# Patient Record
Sex: Male | Born: 1995 | Race: Black or African American | Hispanic: No | Marital: Single | Smoking: Never smoker
Health system: Southern US, Community
[De-identification: ages and names within clinical notes are randomized; demographics above are authoritative.]

## PROBLEM LIST (undated history)

## (undated) DIAGNOSIS — T7840XA Allergy, unspecified, initial encounter: Secondary | ICD-10-CM

## (undated) HISTORY — DX: Allergy, unspecified, initial encounter: T78.40XA

---
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
## 2013-09-11 ENCOUNTER — Encounter: Payer: Self-pay | Admitting: Family Medicine

## 2013-09-11 ENCOUNTER — Ambulatory Visit (INDEPENDENT_AMBULATORY_CARE_PROVIDER_SITE_OTHER): Payer: BC Managed Care – PPO | Admitting: Family Medicine

## 2013-09-11 VITALS — BP 140/80 | HR 83 | Temp 98.7°F | Resp 18 | Ht 71.0 in | Wt 198.0 lb

## 2013-09-11 DIAGNOSIS — R112 Nausea with vomiting, unspecified: Secondary | ICD-10-CM

## 2013-09-11 DIAGNOSIS — R413 Other amnesia: Secondary | ICD-10-CM

## 2013-09-11 LAB — TSH: TSH: 1.27 u[IU]/mL (ref 0.40–5.00)

## 2013-09-11 LAB — CBC WITH DIFFERENTIAL/PLATELET
Basophils Absolute: 0 10*3/uL (ref 0.0–0.1)
Basophils Relative: 0.4 % (ref 0.0–3.0)
EOS PCT: 2.4 % (ref 0.0–5.0)
Eosinophils Absolute: 0.1 10*3/uL (ref 0.0–0.7)
HEMATOCRIT: 42.2 % (ref 36.0–49.0)
HEMOGLOBIN: 14.2 g/dL (ref 12.0–16.0)
LYMPHS ABS: 1.5 10*3/uL (ref 0.7–4.0)
Lymphocytes Relative: 30.3 % (ref 24.0–48.0)
MCHC: 33.7 g/dL (ref 31.0–37.0)
MCV: 87.3 fl (ref 78.0–98.0)
MONO ABS: 0.4 10*3/uL (ref 0.1–1.0)
MONOS PCT: 7.7 % (ref 3.0–12.0)
NEUTROS ABS: 2.9 10*3/uL (ref 1.4–7.7)
Neutrophils Relative %: 59.2 % (ref 43.0–71.0)
PLATELETS: 178 10*3/uL (ref 150.0–575.0)
RBC: 4.84 Mil/uL (ref 3.80–5.70)
RDW: 13.1 % (ref 11.4–15.5)
WBC: 4.9 10*3/uL (ref 4.5–13.5)

## 2013-09-11 LAB — HEPATIC FUNCTION PANEL
ALBUMIN: 4.6 g/dL (ref 3.5–5.2)
ALT: 23 U/L (ref 0–53)
AST: 25 U/L (ref 0–37)
Alkaline Phosphatase: 57 U/L (ref 52–171)
BILIRUBIN TOTAL: 1.1 mg/dL (ref 0.2–1.2)
Bilirubin, Direct: 0.2 mg/dL (ref 0.0–0.3)
TOTAL PROTEIN: 7.6 g/dL (ref 6.0–8.3)

## 2013-09-11 LAB — VITAMIN B12: VITAMIN B 12: 340 pg/mL (ref 211–911)

## 2013-09-11 NOTE — Progress Notes (Signed)
Pre-visit discussion using our clinic review tool. No additional management support is needed unless otherwise documented below in the visit note.  

## 2013-09-11 NOTE — Patient Instructions (Signed)
We will call you with labs.   Will consider referral to psychologist for ADD testing if labs normal.

## 2013-09-11 NOTE — Progress Notes (Signed)
   Subjective:    Patient ID: Larry Carney, male    DOB: 01/27/1996, 18 y.o.   MRN: [id number]  Abdominal Pain Associated symptoms include vomiting. Pertinent negatives include no fever or headaches.  Emesis  Associated symptoms include abdominal pain. Pertinent negatives include no chest pain, chills, dizziness, fever or headaches.   Patient seen with concern for possible memory deficits. He had heavy course of this past semester in college and flunked couple of courses though they were very difficult science courses. He does have and more difficulty with retaining information he has studied. He does not have any chronic medical problems and takes no regular medications. No recent head injury. No history of concussion. No focal neurologic symptoms. No recent headaches.  He does complain of difficulty focusing which he thinks has gotten worse lately. No illicit drug use.  Patient had recent episode over the past weekend of vomiting one day duration. He has some nonspecific intermittent upper abdominal pains which are very inconsistent with no clear triggers. Appetite and weight are stable. Patient states he had very erratic eating of the past semester because of lack of meal plan and eating on the run.  had very little variety in his diet. Nonvegetarian.  Past Medical History  Diagnosis Date  . Allergy    No past surgical history on file.  reports that he has never smoked. He does not have any smokeless tobacco history on file. His alcohol and drug histories are not on file. family history includes Hypertension in his maternal grandfather, maternal grandmother, paternal grandfather, and paternal grandmother; Stroke in his maternal grandmother. No Known Allergies    Review of Systems  Constitutional: Negative for fever and chills.  Respiratory: Negative for shortness of breath.   Cardiovascular: Negative for chest pain.  Gastrointestinal: Positive for vomiting and abdominal pain.    Neurological: Negative for dizziness, tremors, seizures, syncope, weakness and headaches.  Hematological: Negative for adenopathy.       Objective:   Physical Exam  Constitutional: He is oriented to person, place, and time. He appears well-developed and well-nourished.  Cardiovascular: Normal rate and regular rhythm.  Exam reveals no gallop.   No murmur heard. Pulmonary/Chest: Effort normal and breath sounds normal. No respiratory distress. He has no wheezes. He has no rales.  Abdominal: Soft. There is no tenderness.  Musculoskeletal: He exhibits no edema.  Neurological: He is alert and oriented to person, place, and time. No cranial nerve deficit.  Psychiatric: He has a normal mood and affect. His behavior is normal. Judgment and thought content normal.          Assessment & Plan:  #1 concern for subjective memory changes. He is oriented to day of week, date, year. Normal serial 7 subtracton.  Check labs with B12, TSH, hepatic, CBC. Consider referral to psychology for ADD testing #2 recent episode of vomiting over the weekend and question enteritis. Those symptoms are fully resolved.

## 2013-09-13 NOTE — Addendum Note (Signed)
Addended by: Kristian CoveyBURCHETTE, Bob Daversa W on: 09/13/2013 12:51 PM   Modules accepted: Orders

## 2013-09-14 ENCOUNTER — Other Ambulatory Visit: Payer: Self-pay

## 2013-09-14 DIAGNOSIS — F988 Other specified behavioral and emotional disorders with onset usually occurring in childhood and adolescence: Secondary | ICD-10-CM

## 2013-09-30 ENCOUNTER — Telehealth: Payer: Self-pay | Admitting: Family Medicine

## 2013-09-30 DIAGNOSIS — F988 Other specified behavioral and emotional disorders with onset usually occurring in childhood and adolescence: Secondary | ICD-10-CM

## 2013-09-30 DIAGNOSIS — R413 Other amnesia: Secondary | ICD-10-CM

## 2013-09-30 NOTE — Telephone Encounter (Signed)
Pt called to say that Dr Caryl Never had req that her son see a psy and would like to know if this has been set up mon

## 2013-10-01 ENCOUNTER — Other Ambulatory Visit: Payer: Self-pay | Admitting: Family Medicine

## 2013-10-01 DIAGNOSIS — R413 Other amnesia: Secondary | ICD-10-CM

## 2013-10-01 DIAGNOSIS — F988 Other specified behavioral and emotional disorders with onset usually occurring in childhood and adolescence: Secondary | ICD-10-CM

## 2013-10-01 NOTE — Telephone Encounter (Signed)
We put in referral to Dr Reggy Eye.  Can we see where that stands?

## 2013-10-01 NOTE — Telephone Encounter (Signed)
Ordered the referral for the patient.

## 2013-10-08 ENCOUNTER — Telehealth: Payer: Self-pay | Admitting: Family Medicine

## 2013-10-08 NOTE — Telephone Encounter (Signed)
Per dr. Reggy Eye the pt can not be seen in developmental/phys center because of his insurance, however he can be seen at [facility] and dr. Reggy Eye is there on Wednesday 9-5 and Friday from 9-12. Requesting referral to be sent there

## 2015-09-30 ENCOUNTER — Ambulatory Visit (INDEPENDENT_AMBULATORY_CARE_PROVIDER_SITE_OTHER): Payer: BC Managed Care – PPO | Admitting: Family Medicine

## 2015-09-30 ENCOUNTER — Encounter: Payer: Self-pay | Admitting: Family Medicine

## 2015-09-30 VITALS — BP 100/64 | HR 66 | Temp 98.2°F | Ht 71.0 in | Wt 201.9 lb

## 2015-09-30 DIAGNOSIS — Z0000 Encounter for general adult medical examination without abnormal findings: Secondary | ICD-10-CM

## 2015-09-30 LAB — LIPID PANEL
CHOLESTEROL: 116 mg/dL (ref 0–200)
HDL: 35.8 mg/dL — ABNORMAL LOW (ref >39.00)
LDL CALC: 69 mg/dL (ref 0–99)
NonHDL: 79.79
Total CHOL/HDL Ratio: 3
Triglycerides: 52 mg/dL (ref 0.0–149.0)
VLDL: 10.4 mg/dL (ref 0.0–40.0)

## 2015-09-30 LAB — CBC WITH DIFFERENTIAL/PLATELET
BASOS PCT: 1 % (ref 0.0–3.0)
Basophils Absolute: 0 10*3/uL (ref 0.0–0.1)
EOS ABS: 0.1 10*3/uL (ref 0.0–0.7)
Eosinophils Relative: 2.8 % (ref 0.0–5.0)
HEMATOCRIT: 42.1 % (ref 39.0–52.0)
HEMOGLOBIN: 14 g/dL (ref 13.0–17.0)
LYMPHS PCT: 34.5 % (ref 12.0–46.0)
Lymphs Abs: 1.5 10*3/uL (ref 0.7–4.0)
MCHC: 33.3 g/dL (ref 30.0–36.0)
MCV: 87.4 fl (ref 78.0–100.0)
MONOS PCT: 6.8 % (ref 3.0–12.0)
Monocytes Absolute: 0.3 10*3/uL (ref 0.1–1.0)
Neutro Abs: 2.5 10*3/uL (ref 1.4–7.7)
Neutrophils Relative %: 54.9 % (ref 43.0–77.0)
Platelets: 172 10*3/uL (ref 150.0–400.0)
RBC: 4.82 Mil/uL (ref 4.22–5.81)
RDW: 12.8 % (ref 11.5–15.5)
WBC: 4.5 10*3/uL (ref 4.0–10.5)

## 2015-09-30 LAB — BASIC METABOLIC PANEL
BUN: 12 mg/dL (ref 6–23)
CALCIUM: 9.9 mg/dL (ref 8.4–10.5)
CHLORIDE: 105 meq/L (ref 96–112)
CO2: 29 mEq/L (ref 19–32)
CREATININE: 1.06 mg/dL (ref 0.40–1.50)
GFR: 112.63 mL/min (ref >60.00)
Glucose, Bld: 83 mg/dL (ref 70–99)
Potassium: 4.2 mEq/L (ref 3.5–5.1)
Sodium: 141 mEq/L (ref 135–145)

## 2015-09-30 LAB — HEPATIC FUNCTION PANEL
ALT: 19 U/L (ref 0–53)
AST: 40 U/L — AB (ref 0–37)
Albumin: 4.8 g/dL (ref 3.5–5.2)
Alkaline Phosphatase: 70 U/L (ref 39–117)
BILIRUBIN DIRECT: 0.1 mg/dL (ref 0.0–0.3)
BILIRUBIN TOTAL: 0.5 mg/dL (ref 0.2–1.2)
Total Protein: 7.4 g/dL (ref 6.0–8.3)

## 2015-09-30 LAB — TSH: TSH: 1.26 u[IU]/mL (ref 0.35–4.50)

## 2015-09-30 MED ORDER — ALUMINUM CHLORIDE 20 % EX SOLN: Freq: Every day | CUTANEOUS | Status: DC

## 2015-09-30 NOTE — Progress Notes (Signed)
Pre visit review using our clinic review tool, if applicable. No additional management support is needed unless otherwise documented below in the visit note. 

## 2015-09-30 NOTE — Progress Notes (Signed)
   Subjective:    Patient ID: Larry Carney, male    DOB: 09/05/1995, 20 y.o.   MRN: [id number]  HPI Patient is here requesting well visit. He is in college and plans to take summer classes. He is still undecided regarding major. Has generally been very healthy. Takes no regular medications. No cigarette use. Has smoked marijuana intermittently past couple of years. Denies any other illicit drug use. He exercises fairly regularly. He states he is not eating well during the past year with eating a lot of "junk food"  Past Medical History  Diagnosis Date  . Allergy    No past surgical history on file.  reports that he has never smoked. He does not have any smokeless tobacco history on file. His alcohol and drug histories are not on file. family history includes Hypertension in his maternal grandfather, maternal grandmother, paternal grandfather, and paternal grandmother; Stroke in his maternal grandmother. No Known Allergies     Review of Systems  Constitutional: Negative for fever, activity change, appetite change and fatigue.  HENT: Negative for congestion, ear pain and trouble swallowing.   Eyes: Negative for pain and visual disturbance.  Respiratory: Negative for cough, shortness of breath and wheezing.   Cardiovascular: Negative for chest pain and palpitations.  Gastrointestinal: Negative for nausea, vomiting, abdominal pain, diarrhea, constipation, blood in stool, abdominal distention and rectal pain.  Genitourinary: Negative for dysuria, hematuria and testicular pain.  Musculoskeletal: Negative for joint swelling and arthralgias.  Skin: Negative for rash.  Neurological: Negative for dizziness, syncope and headaches.  Hematological: Negative for adenopathy.  Psychiatric/Behavioral: Negative for confusion and dysphoric mood.       Objective:   Physical Exam  Constitutional: He is oriented to person, place, and time. He appears well-developed and well-nourished. No  distress.  HENT:  Head: Normocephalic and atraumatic.  Right Ear: External ear normal.  Left Ear: External ear normal.  Mouth/Throat: Oropharynx is clear and moist.  Eyes: Conjunctivae and EOM are normal. Pupils are equal, round, and reactive to light.  Neck: Normal range of motion. Neck supple. No thyromegaly present.  Cardiovascular: Normal rate, regular rhythm and normal heart sounds.   No murmur heard. Pulmonary/Chest: No respiratory distress. He has no wheezes. He has no rales.  Abdominal: Soft. Bowel sounds are normal. He exhibits no distension and no mass. There is no tenderness. There is no rebound and no guarding.  Musculoskeletal: He exhibits no edema.  Lymphadenopathy:    He has no cervical adenopathy.  Neurological: He is alert and oriented to person, place, and time. He displays normal reflexes. No cranial nerve deficit.  Skin: No rash noted.  Psychiatric: He has a normal mood and affect.          Assessment & Plan:  Physical exam. Obtain screening lab work. We discussed healthy eating habits. Long discussion regarding concerns regarding marijuana use. He is currently not using that or any other illicit drugs.   Kristian CoveyBruce W Kassandra Meriweather MD [facility]

## 2015-10-06 ENCOUNTER — Other Ambulatory Visit: Payer: Self-pay | Admitting: Family Medicine

## 2015-10-06 ENCOUNTER — Telehealth: Payer: Self-pay | Admitting: Family Medicine

## 2015-10-06 DIAGNOSIS — R7401 Elevation of levels of liver transaminase levels: Secondary | ICD-10-CM

## 2015-10-06 DIAGNOSIS — R740 Nonspecific elevation of levels of transaminase and lactic acid dehydrogenase [LDH]: Principal | ICD-10-CM

## 2015-10-06 NOTE — Telephone Encounter (Signed)
Pt mom would like her son blood work results. Mom is on DPR

## 2015-10-07 NOTE — Telephone Encounter (Signed)
Left message with mom to call back---but then saw that pt was already aware.

## 2015-12-06 ENCOUNTER — Other Ambulatory Visit: Payer: BC Managed Care – PPO

## 2015-12-06 ENCOUNTER — Other Ambulatory Visit (INDEPENDENT_AMBULATORY_CARE_PROVIDER_SITE_OTHER): Payer: BC Managed Care – PPO

## 2015-12-06 DIAGNOSIS — R7401 Elevation of levels of liver transaminase levels: Secondary | ICD-10-CM

## 2015-12-06 DIAGNOSIS — R740 Nonspecific elevation of levels of transaminase and lactic acid dehydrogenase [LDH]: Secondary | ICD-10-CM | POA: Diagnosis not present

## 2015-12-06 LAB — HEPATIC FUNCTION PANEL
ALK PHOS: 58 U/L (ref 39–117)
ALT: 14 U/L (ref 0–53)
AST: 24 U/L (ref 0–37)
Albumin: 4.6 g/dL (ref 3.5–5.2)
BILIRUBIN DIRECT: 0.1 mg/dL (ref 0.0–0.3)
BILIRUBIN TOTAL: 0.5 mg/dL (ref 0.2–1.2)
TOTAL PROTEIN: 7.2 g/dL (ref 6.0–8.3)

## 2016-05-08 ENCOUNTER — Telehealth: Payer: Self-pay | Admitting: Family Medicine

## 2016-05-08 NOTE — Telephone Encounter (Signed)
Pt called back and do not need the form filled out please disregard.

## 2016-05-08 NOTE — Telephone Encounter (Signed)
Pt would like to have a university form filled out so he is able to attend school it is need tomorrow they would like to drop form off today to be completed is it possible that this could be done today for the pt?

## 2016-05-09 ENCOUNTER — Encounter: Payer: BC Managed Care – PPO | Admitting: Family Medicine

## 2016-06-26 ENCOUNTER — Ambulatory Visit (INDEPENDENT_AMBULATORY_CARE_PROVIDER_SITE_OTHER): Payer: BC Managed Care – PPO | Admitting: Family Medicine

## 2016-06-26 ENCOUNTER — Encounter: Payer: Self-pay | Admitting: Family Medicine

## 2016-06-26 VITALS — BP 104/80 | HR 122 | Ht 71.0 in | Wt 213.0 lb

## 2016-06-26 DIAGNOSIS — R51 Headache: Secondary | ICD-10-CM

## 2016-06-26 DIAGNOSIS — R519 Headache, unspecified: Secondary | ICD-10-CM

## 2016-06-26 NOTE — Progress Notes (Signed)
Subjective:     Patient ID: Larry Carney, male   DOB: 11/13/1995, 21 y.o.   MRN: [id number]  HPI  Patient seen with chief complaint of headache.  Generally does not experience headaches. He's had three-week history of almost daily headaches. His headaches are somewhat intermittent. 6 out of 10 intensity. Achy quality. Location is right temporal region without radiation. He denies any sinusitis symptoms.  He has noted that headaches seem to be worse occasionally when bending over and seemed to be worse somewhat at night. No recent injury. He's taken some Tylenol without relief. Denies any associated nausea or vomiting. No appetite or weight changes. No fevers or chills. No dizziness. No syncope. No cognitive changes. He's not had any focal neurologic symptoms. No visual changes. No hearing loss. No history of tension or migraine headaches.  Generally healthy.Takes no regular medications.  Past Medical History:  Diagnosis Date  . Allergy    No past surgical history on file.  reports that he has never smoked. He has never used smokeless tobacco. His alcohol and drug histories are not on file. family history includes Hypertension in his maternal grandfather, maternal grandmother, paternal grandfather, and paternal grandmother; Stroke in his maternal grandmother. No Known Allergies  Review of Systems  Constitutional: Negative for appetite change, chills, fever and unexpected weight change.  Eyes: Negative for visual disturbance.  Respiratory: Negative for cough and shortness of breath.   Cardiovascular: Negative for chest pain.  Neurological: Positive for headaches. Negative for dizziness, seizures, syncope, facial asymmetry, speech difficulty and weakness.       Objective:   Physical Exam  Constitutional: He is oriented to person, place, and time. He appears well-developed and well-nourished. No distress.  Eyes: Pupils are equal, round, and reactive to light.  Neck: Neck supple.  No thyromegaly present.  Cardiovascular: Normal rate and regular rhythm.   Pulmonary/Chest: Effort normal and breath sounds normal. No respiratory distress. He has no wheezes. He has no rales.  Lymphadenopathy:    He has no cervical adenopathy.  Neurological: He is alert and oriented to person, place, and time. No cranial nerve deficit. Coordination normal.  Skin: No rash noted.       Assessment:     Atypical unilateral headache. Atypical features include the fact this is unilateral and does not fit criteria for migraine or tension-type headache. He does have some exacerbation with movement such as bending. Fortunately, nonfocal neuro exam    Plan:     -Recommend further evaluation with neuro imaging with MRI and MR angiogram. -Handout given on headache -Follow-up immediately for any recurrent vomiting, focal neurologic symptoms, confusion, seizure  Kristian CoveyBruce W Merlon Alcorta MD [facility]

## 2016-06-26 NOTE — Progress Notes (Signed)
Pre visit review using our clinic review tool, if applicable. No additional management support is needed unless otherwise documented below in the visit note. 

## 2016-06-26 NOTE — Patient Instructions (Signed)
We will set up MRI to further assess your headaches.     General Headache Without Cause A headache is pain or discomfort felt around the head or neck area. The specific cause of a headache may not be found. There are many causes and types of headaches. A few common ones are:  Tension headaches.  Migraine headaches.  Cluster headaches.  Chronic daily headaches. Follow these instructions at home: Watch your condition for any changes. Take these steps to help with your condition: Managing pain   Take over-the-counter and prescription medicines only as told by your health care provider.  Lie down in a dark, quiet room when you have a headache.  If directed, apply ice to the head and neck area:  Put ice in a plastic bag.  Place a towel between your skin and the bag.  Leave the ice on for 20 minutes, 2-3 times per day.  Use a heating pad or hot shower to apply heat to the head and neck area as told by your health care provider.  Keep lights dim if bright lights bother you or make your headaches worse. Eating and drinking   Eat meals on a regular schedule.  Limit alcohol use.  Decrease the amount of caffeine you drink, or stop drinking caffeine. General instructions   Keep all follow-up visits as told by your health care provider. This is important.  Keep a headache journal to help find out what may trigger your headaches. For example, write down:  What you eat and drink.  How much sleep you get.  Any change to your diet or medicines.  Try massage or other relaxation techniques.  Limit stress.  Sit up straight, and do not tense your muscles.  Do not use tobacco products, including cigarettes, chewing tobacco, or e-cigarettes. If you need help quitting, ask your health care provider.  Exercise regularly as told by your health care provider.  Sleep on a regular schedule. Get 7-9 hours of sleep, or the amount recommended by your health care provider. Contact a  health care provider if:  Your symptoms are not helped by medicine.  You have a headache that is different from the usual headache.  You have nausea or you vomit.  You have a fever. Get help right away if:  Your headache becomes severe.  You have repeated vomiting.  You have a stiff neck.  You have a loss of vision.  You have problems with speech.  You have pain in the eye or ear.  You have muscular weakness or loss of muscle control.  You lose your balance or have trouble walking.  You feel faint or pass out.  You have confusion. This information is not intended to replace advice given to you by your health care provider. Make sure you discuss any questions you have with your health care provider. Document Released: 04/16/2005 Document Revised: 09/22/2015 Document Reviewed: 08/09/2014 Elsevier Interactive Patient Education  2017 [employer]

## 2016-07-05 ENCOUNTER — Telehealth: Payer: Self-pay | Admitting: Family Medicine

## 2016-07-05 NOTE — Telephone Encounter (Signed)
The insurance company didn't approve the patients MR MRA HEAD WO CONTRAST only the MRI.  [facility], please contact Danelle Earthlyoel @[phone]

## 2016-07-05 NOTE — Telephone Encounter (Signed)
I guess we will have to go along with just the MRI-  Let pt know his insurance is refusing to cover the MRA.

## 2016-07-07 ENCOUNTER — Ambulatory Visit
Admission: RE | Admit: 2016-07-07 | Discharge: 2016-07-07 | Disposition: A | Discharge status: Home / Self Care | Payer: BC Managed Care – PPO | Source: Ambulatory Visit | Attending: Family Medicine | Admitting: Family Medicine

## 2016-07-07 ENCOUNTER — Other Ambulatory Visit: Payer: BC Managed Care – PPO

## 2016-07-07 DIAGNOSIS — R519 Headache, unspecified: Secondary | ICD-10-CM

## 2016-07-07 DIAGNOSIS — R51 Headache: Principal | ICD-10-CM

## 2016-09-05 ENCOUNTER — Ambulatory Visit (INDEPENDENT_AMBULATORY_CARE_PROVIDER_SITE_OTHER): Payer: BC Managed Care – PPO | Admitting: Family Medicine

## 2016-09-05 ENCOUNTER — Encounter: Payer: Self-pay | Admitting: Family Medicine

## 2016-09-05 VITALS — BP 120/80 | HR 64 | Temp 98.3°F | Wt 213.5 lb

## 2016-09-05 DIAGNOSIS — H109 Unspecified conjunctivitis: Secondary | ICD-10-CM | POA: Diagnosis not present

## 2016-09-05 DIAGNOSIS — J069 Acute upper respiratory infection, unspecified: Secondary | ICD-10-CM

## 2016-09-05 DIAGNOSIS — B9789 Other viral agents as the cause of diseases classified elsewhere: Secondary | ICD-10-CM | POA: Diagnosis not present

## 2016-09-05 MED ORDER — POLYMYXIN B-TRIMETHOPRIM 10000-0.1 UNIT/ML-% OP SOLN: 2.0000 [drp] | OPHTHALMIC | 0 refills | Status: DC

## 2016-09-05 NOTE — Patient Instructions (Signed)

## 2016-09-05 NOTE — Progress Notes (Signed)
Subjective:     Patient ID: Larry Carney, male   DOB: 10/28/1995, 21 y.o.   MRN: [id number]  HPI Patient seen with one-week history of sore throat, nasal congestion, cough.  also a couple days of some right eye redness. He's had some matted thick drainage early in the mornings. Improves with warm compresses. No blurred vision or loss of vision. No contact use. No eye injury. Sore throat symptoms of been worse at night. May of had some low-grade fever initially but none now. Cough mostly dry  Past Medical History:  Diagnosis Date  . Allergy    No past surgical history on file.  reports that he has never smoked. He has never used smokeless tobacco. His alcohol and drug histories are not on file. family history includes Hypertension in his maternal grandfather, maternal grandmother, paternal grandfather, and paternal grandmother; Stroke in his maternal grandmother. No Known Allergies   Review of Systems  Constitutional: Positive for fatigue. Negative for chills and fever.  HENT: Positive for congestion and sore throat.   Eyes: Positive for discharge, redness and itching. Negative for visual disturbance.  Respiratory: Positive for cough.        Objective:   Physical Exam  Constitutional: He appears well-developed and well-nourished.  HENT:  Right Ear: External ear normal.  Left Ear: External ear normal.  Minimal erythema posterior pharynx. No exudate  Eyes: EOM are normal.  Right conjunctivae erythematous. Left is normal  Neck: Neck supple.  Cardiovascular: Normal rate and regular rhythm.   Pulmonary/Chest: Effort normal and breath sounds normal. No respiratory distress. He has no wheezes. He has no rales.  Lymphadenopathy:    He has no cervical adenopathy.       Assessment:     #1 probable viral URI with cough  #2 bacterial conjunctivitis right eye    Plan:     -Continue warm compresses to right eye several times daily -Polytrim ophthalmic drops to right eye every 4  hours while awake -Treat URI symptoms symptomatically. Try Advil or Aleve for sore throat symptoms  Kristian CoveyBruce W Burchette MD [facility]

## 2017-07-15 ENCOUNTER — Ambulatory Visit (INDEPENDENT_AMBULATORY_CARE_PROVIDER_SITE_OTHER): Payer: BC Managed Care – PPO | Admitting: Family Medicine

## 2017-07-15 ENCOUNTER — Encounter: Payer: Self-pay | Admitting: Family Medicine

## 2017-07-15 VITALS — BP 110/70 | HR 65 | Temp 98.4°F | Wt 208.6 lb

## 2017-07-15 DIAGNOSIS — M76899 Other specified enthesopathies of unspecified lower limb, excluding foot: Secondary | ICD-10-CM

## 2017-07-15 MED ORDER — DICLOFENAC SODIUM 1 % TD GEL: 2.0000 g | Freq: Four times a day (QID) | TRANSDERMAL | 1 refills | Status: DC

## 2017-07-15 MED ORDER — ALUMINUM CHLORIDE 20 % EX SOLN: Freq: Every day | CUTANEOUS | 1 refills | Status: DC

## 2017-07-15 NOTE — Patient Instructions (Signed)
Use the Voltaren gel three to four times daily Recommend ice 2-3 times daily.   Let me know in 3-4 weeks if not better Avoid squats.

## 2017-07-15 NOTE — Progress Notes (Signed)
Subjective:     Patient ID: Larry Carney, male   DOB: 03/22/1996, 22 y.o.   MRN: [id number]  HPI Patient seen with right knee pain. Duration about 4-5 month. Denies specific injury. 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 That seems be aggravating. Pain is just above the patella region (superior to patella). He's not seen any weakness. No visible swelling. No ecchymosis. No warmth or erythema. Has not tried any icing. No anti-inflammatory medications.  Past Medical History:  Diagnosis Date  . Allergy    No past surgical history on file.  reports that  has never smoked. he has never used smokeless tobacco. His alcohol and drug histories are not on file. family history includes Hypertension in his maternal grandfather, maternal grandmother, paternal grandfather, and paternal grandmother; Stroke in his maternal grandmother. No Known Allergies   Review of Systems  Neurological: Negative for weakness and numbness.       Objective:   Physical Exam  Constitutional: He appears well-developed and well-nourished.  Cardiovascular: Normal rate and regular rhythm.  Musculoskeletal:  Right knee full range of motion. No effusion. No prepatellar tenderness. Minimal tenderness just superior to patellar region. Mild pain with leg extension against resistance. No medial or lateral jointline tenderness       Assessment:     Question quadriceps tendonitis.      Plan:     -Recommended icing 2-3 times daily 15-20 minutes of time -Avoid aggravating activities such as squatting -Voltaren gel applied 3-4 times daily -Touch base in 3-4 weeks if not improving  -consider sports medicine referral if no better in 3 weeks.  Kristian CoveyBruce W Burchette MD [facility]

## 2017-07-16 ENCOUNTER — Telehealth: Payer: Self-pay | Admitting: Family Medicine

## 2017-07-16 NOTE — Telephone Encounter (Signed)
Copied from CRM [id number]. Topic: Quick Communication - See Telephone Encounter >> Jul 16, 2017  9:16 AM Clack, Princella PellegriniJessica D wrote: CRM for notification. See Telephone encounter for:  Pt states his insurance needs a PA on his diclofenac sodium (VOLTAREN) 1 % GEL [[id number]][[id number]].  07/16/17.

## 2017-07-16 NOTE — Telephone Encounter (Signed)
Prior auth sent to [id number].

## 2017-07-18 NOTE — Telephone Encounter (Signed)
Note in [email or web address] stated the request was denied.  I called the pt and informed him of this and he is aware a message will be sent to Dr Caryl NeverBurchette for further recommendations.

## 2017-07-18 NOTE — Telephone Encounter (Signed)
Patient checking status, call back [phone]

## 2017-07-19 NOTE — Telephone Encounter (Signed)
Informed pt .

## 2017-07-19 NOTE — Telephone Encounter (Signed)
I left a detailed message with the information below at the pts cell number. 

## 2017-07-19 NOTE — Telephone Encounter (Signed)
Doubt his insurance will cover any topicals.  Would focus on icing 20 minutes two to three times daily.

## 2017-09-13 IMAGING — MR MR HEAD W/O CM
10 of 11 series · 38 of 48 positions shown · non-contrast
Comparison: None.

CLINICAL DATA: Progressive right-sided headache over the last
weeks.

EXAM:
MRI HEAD WITHOUT CONTRAST
TECHNIQUE: Multiplanar, multiecho pulse sequences of the brain and surrounding
structures were obtained without intravenous contrast.

[Series 2: T1 · sagittal · 5.0mm · 0.49mm/px · 2 of 23 slices shown]
[im 1/23]
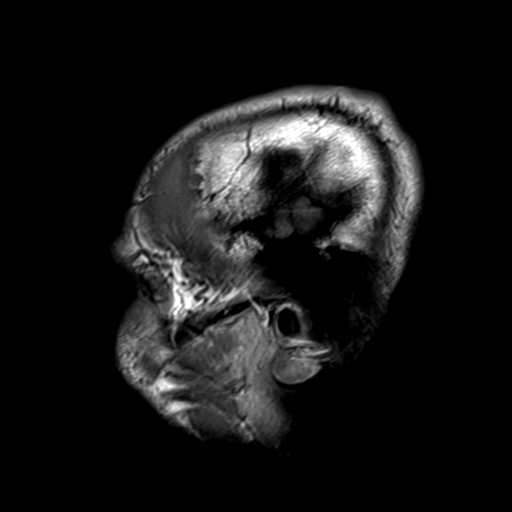
[im 23/23]
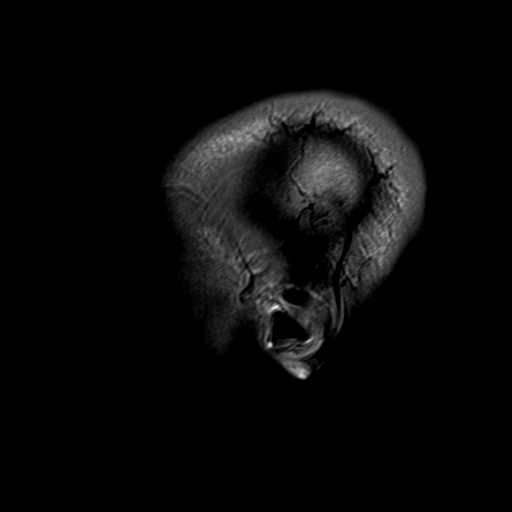

[Series 3: ax ep2d_diff_(id)_trace · axial · 3.0mm · 1.88mm/px · z∈[-60,+93]mm · 7 of 102 slices shown]
[im 1/102]
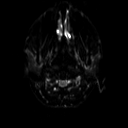
[im 17/102]
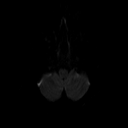
[im 34/102]
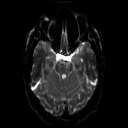
[im 51/102]
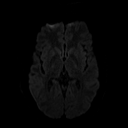
[im 68/102]
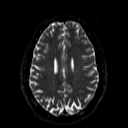
[im 85/102]
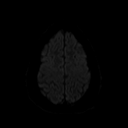
[im 102/102]
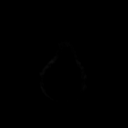

[Series 4: ax ep2d_diff_(id)_trace_adc · axial · 3.0mm · 1.88mm/px · z∈[-60,+93]mm · 4 of 51 slices shown]
[im 1/51]
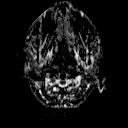
[im 17/51]
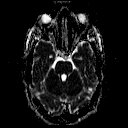
[im 34/51]
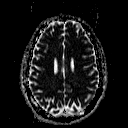
[im 51/51]
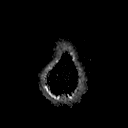

[Series 5: mip_images(sw) · axial · 16.0mm · 0.94mm/px · z∈[-55,+88]mm · 5 of 73 slices shown]
[im 1/73]
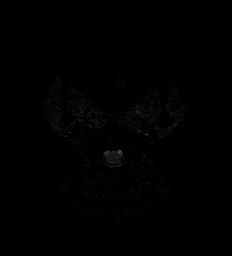
[im 19/73]
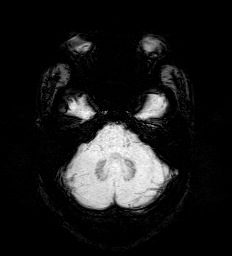
[im 37/73]
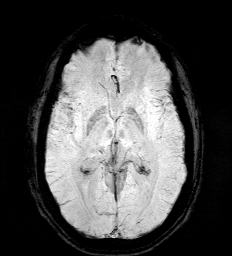
[im 55/73]
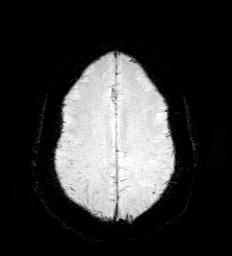
[im 73/73]
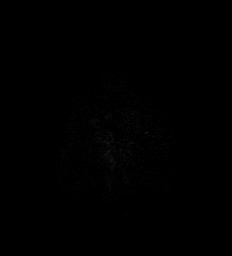

[Series 6: swi_images · axial · 2.0mm · 0.94mm/px · z∈[-62,+95]mm · 6 of 80 slices shown]
[im 1/80]
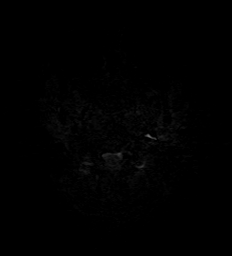
[im 16/80]
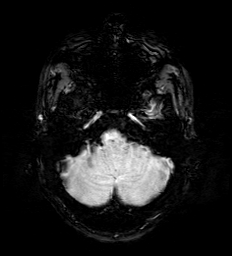
[im 32/80]
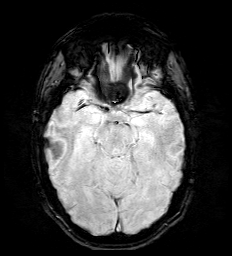
[im 48/80]
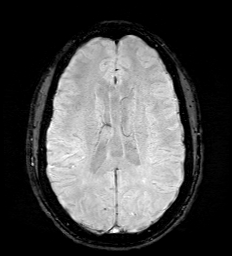
[im 64/80]
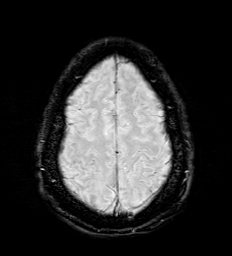
[im 80/80]
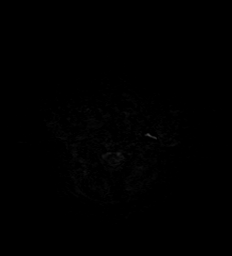

[Series 7: cor ep2d_diff (if · coronal · 5.0mm · 1.77mm/px · 4 of 60 slices shown (1 of 2)]
[im 1/60]
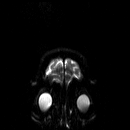
[im 20/60]
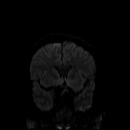
[im 40/60]
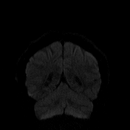
[im 60/60]
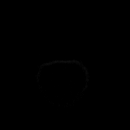

[Series 8: cor ep2d_diff (if · coronal · 5.0mm · 1.77mm/px · 2 of 30 slices shown (2 of 2)]
[im 1/30]
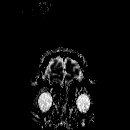
[im 30/30]
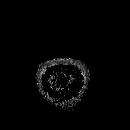

[Series 9: FLAIR · axial · 3.0mm · 0.65mm/px · z∈[-61,+91]mm · 4 of 52 slices shown]
[im 1/52]
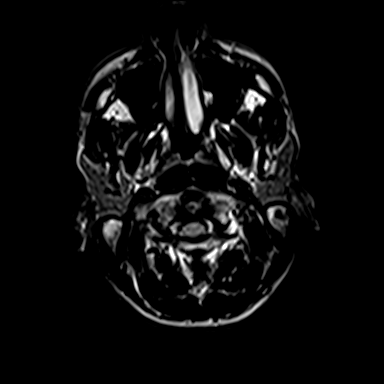
[im 18/52]
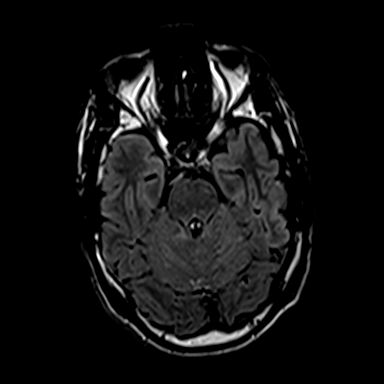
[im 35/52]
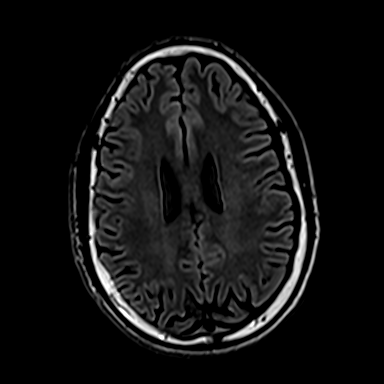
[im 52/52]
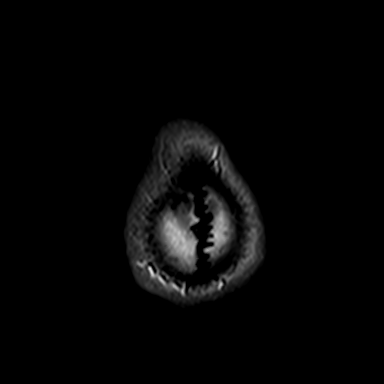

[Series 11: T2 · axial · 5.0mm · 0.65mm/px · z∈[-62,+94]mm · 2 of 26 slices shown (1 of 2)]
[im 1/26]
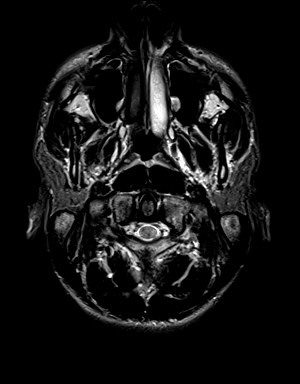
[im 26/26]
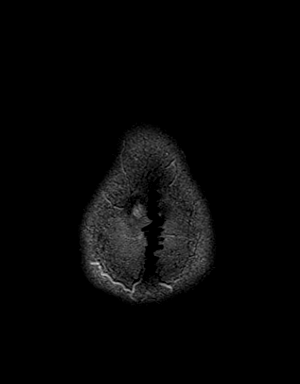

[Series 12: T2 · coronal · 5.0mm · 0.47mm/px · 2 of 31 slices shown (2 of 2)]
[im 1/31]
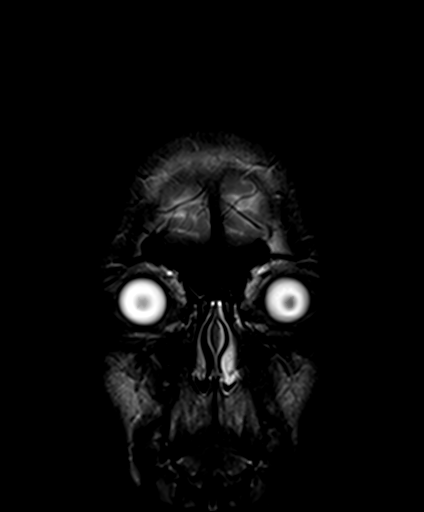
[im 31/31]
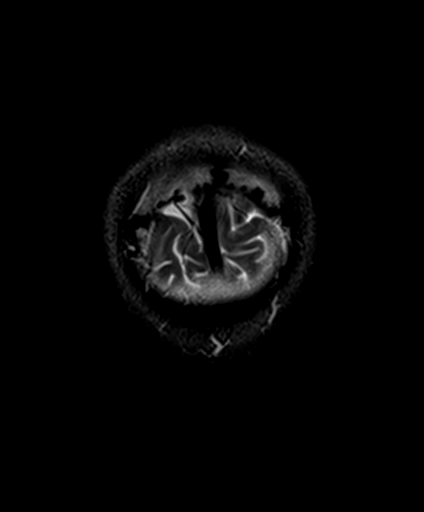

[38 of 48 positions shown; findings below may reference images not displayed]

FINDINGS: Brain: No acute infarct, hemorrhage, or mass lesion is present.
Incidental note is made of a persistent cavum septum pellucidum et
vergae dilated perivascular spaces are noted posteriorly. There is
no significant white matter disease. The ventricles are of normal
size. No significant extra-axial fluid collection is present.

Vascular: Flow is present in the major intracranial arteries.

Skull and upper cervical spine: The skullbase is within normal
limits. The craniocervical junction is normal. Marrow signal is
normal. The upper cervical spine is unremarkable. Midline sagittal
structures are within normal limits.

Sinuses/Orbits: Small polyps or mucous retention cysts are present
in the medial maxillary sinuses bilaterally. There is mild mucosal
thickening within mid ethmoid air cells on the right. The remaining
paranasal sinuses are clear. The mastoid air cells are clear.
IMPRESSION: 1. Normal MRI the brain.
2. Minimal sinus disease.
3. No acute or focal lesion to explain the patient's right-sided
headache.

## 2018-02-21 ENCOUNTER — Ambulatory Visit: Payer: BC Managed Care – PPO | Admitting: Family Medicine

## 2018-02-21 DIAGNOSIS — Z0289 Encounter for other administrative examinations: Secondary | ICD-10-CM

## 2018-03-01 ENCOUNTER — Encounter (HOSPITAL_COMMUNITY): Payer: Self-pay | Admitting: Emergency Medicine

## 2018-03-01 ENCOUNTER — Other Ambulatory Visit: Payer: Self-pay

## 2018-03-01 ENCOUNTER — Emergency Department (HOSPITAL_COMMUNITY)
Admission: EM | Admit: 2018-03-01 | Discharge: 2018-03-02 | Disposition: A | Discharge status: Home / Self Care | Payer: BC Managed Care – PPO | Attending: Emergency Medicine | Admitting: Emergency Medicine

## 2018-03-01 DIAGNOSIS — K0889 Other specified disorders of teeth and supporting structures: Secondary | ICD-10-CM | POA: Diagnosis present

## 2018-03-01 NOTE — ED Triage Notes (Signed)
Pt has tooth pain on the right that is radiating up to cause a headache. No fever, chills, or nausea.

## 2018-03-02 MED ORDER — AMOXICILLIN 500 MG PO CAPS: 500.0000 mg | ORAL_CAPSULE | Freq: Three times a day (TID) | ORAL | 0 refills | Status: DC

## 2018-03-02 MED ORDER — NAPROXEN 500 MG PO TABS: 500.0000 mg | ORAL_TABLET | Freq: Two times a day (BID) | ORAL | 0 refills | Status: DC | PRN

## 2018-03-02 MED ORDER — TRAMADOL HCL 50 MG PO TABS: 50.0000 mg | ORAL_TABLET | Freq: Three times a day (TID) | ORAL | 0 refills | Status: DC | PRN

## 2018-03-02 NOTE — ED Notes (Signed)
Patient left at this time with all belongings. 

## 2018-03-02 NOTE — ED Notes (Signed)
EDP at bedside  

## 2018-03-02 NOTE — Discharge Instructions (Signed)
Follow-up with your dentist as soon as you are able.  Only a dentist can fix your problem.  You may need to follow-up with an oral surgeon given that your pain is originating from your wisdom tooth.  Take amoxicillin as prescribed to prevent any worsening infection.  We recommend continued use of anti-inflammatory medications such as naproxen for pain management.  You have been prescribed tramadol to take as needed for severe pain.  Do not drive or drink alcohol after taking this medication as it may make you drowsy and impair your judgment.  Return to the ED for any new or concerning symptoms.

## 2018-03-02 NOTE — ED Notes (Signed)
Pt ambulatory from lobby to [facility], steady gait noted

## 2018-03-02 NOTE — ED Provider Notes (Signed)
[facility] EMERGENCY DEPARTMENT Provider Note   CSN: [id number] Arrival date & time: 03/01/18  2257     History   Chief Complaint Chief Complaint  Patient presents with  . Dental Pain    HPI Larry Carney is a 22 y.o. male.  22 year old male presenting for fairly constant, sharp, stabbing right sided facial pain originating from a right upper tooth.  Reports the tooth being cracked, but he has not had any recent trauma or injury to the area.  No swelling of the face, drainage in the mouth, fevers, increased discomfort with chewing.  Tried to call his dentist on Friday, but the office was closed.  The history is provided by the patient. No language interpreter was used.  Dental Pain   This is a new problem. Episode onset: 2-3 days ago. The problem occurs daily. The problem has been gradually worsening. The pain is moderate. Treatments tried: Aleve. The treatment provided moderate relief.    Past Medical History:  Diagnosis Date  . Allergy     There are no active problems to display for this patient.   History reviewed. No pertinent surgical history.      Home Medications    Prior to Admission medications   Medication Sig Start Date End Date Taking? Authorizing Provider  aluminum chloride (DRYSOL) 20 % external solution Apply topically at bedtime. 07/15/17   Burchette, Elberta Fortis, MD  amoxicillin (AMOXIL) 500 MG capsule Take 1 capsule (500 mg total) by mouth 3 (three) times daily. 03/02/18   Antony Madura, PA-C  diclofenac sodium (VOLTAREN) 1 % GEL Apply 2 g topically 4 (four) times daily. 07/15/17   Burchette, Elberta Fortis, MD  naproxen (NAPROSYN) 500 MG tablet Take 1 tablet (500 mg total) by mouth every 12 (twelve) hours as needed for mild pain or moderate pain. 03/02/18   Antony Madura, PA-C  traMADol (ULTRAM) 50 MG tablet Take 1 tablet (50 mg total) by mouth every 8 (eight) hours as needed for severe pain. 03/02/18   Antony Madura, PA-C    Family  History Family History  Problem Relation Age of Onset  . Stroke Maternal Grandmother   . Hypertension Maternal Grandmother   . Hypertension Maternal Grandfather   . Hypertension Paternal Grandmother   . Hypertension Paternal Grandfather     Social History Social History   Tobacco Use  . Smoking status: Never Smoker  . Smokeless tobacco: Never Used  Substance Use Topics  . Alcohol use: Never    Frequency: Never  . Drug use: Never     Allergies   Patient has no known allergies.   Review of Systems Review of Systems Ten systems reviewed and are negative for acute change, except as noted in the HPI.    Physical Exam Updated Vital Signs BP 117/71 (BP Location: Left Arm)   Pulse 87   Temp 97.7 F (36.5 C) (Oral)   Resp 16   Ht 5\' 10"  (1.778 m)   Wt 93.9 kg   SpO2 100%   BMI 29.70 kg/m   Physical Exam  Constitutional: He is oriented to person, place, and time. He appears well-developed and well-nourished. No distress.  Nontoxic and in no distress  HENT:  Head: Normocephalic and atraumatic.  Tenderness to palpation of the right upper wisdom tooth.  There is no gingival swelling or fluctuance.  No trismus.  Patient tolerating secretions without difficulty.  Eyes: Conjunctivae and EOM are normal. No scleral icterus.  Neck: Normal range of motion.  Pulmonary/Chest: Effort normal. No respiratory distress.  Respirations even and unlabored  Musculoskeletal: Normal range of motion.  Neurological: He is alert and oriented to person, place, and time. He exhibits normal muscle tone. Coordination normal.  Skin: Skin is warm and dry. No rash noted. He is not diaphoretic. No erythema. No pallor.  Psychiatric: He has a normal mood and affect. His behavior is normal.  Nursing note and vitals reviewed.    ED Treatments / Results  Labs (all labs ordered are listed, but only abnormal results are displayed) Labs Reviewed - No data to display  EKG None  Radiology No  results found.  Procedures Procedures (including critical care time)  Medications Ordered in ED Medications - No data to display   Initial Impression / Assessment and Plan / ED Course  I have reviewed the triage vital signs and the nursing notes.  Pertinent labs & imaging results that were available during my care of the patient were reviewed by me and considered in my medical decision making (see chart for details).     Patient with toothache.  No gross abscess.  Exam unconcerning for Ludwig's angina or spread of infection.  Will treat with Amoxicillin and pain medicine.  Urged patient to follow-up with dentist.  Return precautions discussed and provided. Patient discharged in stable condition with no unaddressed concerns.   Final Clinical Impressions(s) / ED Diagnoses   Final diagnoses:  Dentalgia    ED Discharge Orders         Ordered    naproxen (NAPROSYN) 500 MG tablet  Every 12 hours PRN     03/02/18 0057    traMADol (ULTRAM) 50 MG tablet  Every 8 hours PRN     03/02/18 0057    amoxicillin (AMOXIL) 500 MG capsule  3 times daily     03/02/18 0057           Antony Madura, PA-C 03/02/18 Cherylann Ratel, MD 03/02/18 [phone]

## 2018-04-20 ENCOUNTER — Ambulatory Visit (HOSPITAL_COMMUNITY)
Admission: EM | Admit: 2018-04-20 | Discharge: 2018-04-20 | Disposition: A | Discharge status: Home / Self Care | Payer: BC Managed Care – PPO | Attending: Internal Medicine | Admitting: Internal Medicine

## 2018-04-20 ENCOUNTER — Encounter (HOSPITAL_COMMUNITY): Payer: Self-pay | Admitting: Emergency Medicine

## 2018-04-20 DIAGNOSIS — R0789 Other chest pain: Secondary | ICD-10-CM

## 2018-04-20 MED ORDER — NAPROXEN 375 MG PO TABS: 375.0000 mg | ORAL_TABLET | Freq: Two times a day (BID) | ORAL | 0 refills | Status: DC

## 2018-04-20 NOTE — ED Provider Notes (Signed)
[facility]   [id number] 04/20/18 Arrival Time: 1555   CC: CHEST discomfort  SUBJECTIVE:  Larry Carney is a 22 y.o. male who presents with complaint of abrupt chest discomfort that began 4 days ago.  Does admit to performing strenuous chest exercises around the time of his symptoms.  Also, admits to excessive caffeine consumption.  Localizes chest pain to the left side of the mid sternum. Describes as stable that is intermittent, lasting a few seconds at a time, and "pressure feeling, tightness, and throbbing" in character.  Rates pain as 4/10.   Has NOT tried OTC medication.  Symptoms made worse with upper body lifting and leaning forward.  Denies radiating symptoms.  Reports previous symptoms in the past that resolved without treatment. Complains of associated SOB, now resolved.    Denies fever, chills, lightheadedness, dizziness, palpitations, tachycardia, nausea, vomiting, abdominal pain, changes in bowel or bladder habits, diaphoresis, numbness/ tingling in extremities, peripheral edema, or anxiety.    Denies calf pain or swelling, recent long travel, recent surgery, malignancy, tobacco use, hormone use, or previous blood clot  Denies close relatives with cardiac hx  ROS: As per HPI. Past Medical History:  Diagnosis Date  . Allergy    History reviewed. No pertinent surgical history. No Known Allergies No current facility-administered medications on file prior to encounter.    Current Outpatient Medications on File Prior to Encounter  Medication Sig Dispense Refill  . aluminum chloride (DRYSOL) 20 % external solution Apply topically at bedtime. 35 mL 1   Social History   Socioeconomic History  . Marital status: Single    Spouse name: Not on file  . Number of children: Not on file  . Years of education: Not on file  . Highest education level: Not on file  Occupational History  . Not on file  Social Needs  . Financial resource strain: Not on file  . Food  insecurity:    Worry: Not on file    Inability: Not on file  . Transportation needs:    Medical: Not on file    Non-medical: Not on file  Tobacco Use  . Smoking status: Never Smoker  . Smokeless tobacco: Never Used  Substance and Sexual Activity  . Alcohol use: Never    Frequency: Never  . Drug use: Never  . Sexual activity: Not on file  Lifestyle  . Physical activity:    Days per week: Not on file    Minutes per session: Not on file  . Stress: Not on file  Relationships  . Social connections:    Talks on phone: Not on file    Gets together: Not on file    Attends religious service: Not on file    Active member of club or organization: Not on file    Attends meetings of clubs or organizations: Not on file    Relationship status: Not on file  . Intimate partner violence:    Fear of current or ex partner: Not on file    Emotionally abused: Not on file    Physically abused: Not on file    Forced sexual activity: Not on file  Other Topics Concern  . Not on file  Social History Narrative  . Not on file   Family History  Problem Relation Age of Onset  . Stroke Maternal Grandmother   . Hypertension Maternal Grandmother   . Hypertension Maternal Grandfather   . Hypertension Paternal Grandmother   . Hypertension Paternal Grandfather  OBJECTIVE:  Vitals:   04/20/18 1644  BP: 127/68  Pulse: (!) 52  Resp: 18  Temp: 97.8 F (36.6 C)  TempSrc: Oral  SpO2: 100%    General appearance: alert; no distress Eyes: PERRLA; EOMI; conjunctiva normal HENT: normocephalic; atraumatic Neck: supple Lungs: clear to auscultation bilaterally without adventitious breath sounds Heart: regular rate and rhythm.  Clear S1 and S2 without rubs, gallops, or murmur. Radial pulse 2+ bilaterally.  No carotid bruits Chest Wall: point tender to the left of midsternum; no obvious heaves, lifts or thrills Abdomen: soft, non-tender; bowel sounds normal; no masses or organomegaly; no  guarding Extremities: no cyanosis or edema; symmetrical with no gross deformities Skin: warm and dry Psychological: alert and cooperative; normal mood and affect  ECG: Orders placed or performed during the hospital encounter of 04/20/18  . ED EKG  . ED EKG   EKG normal sinus rhythm without ST elevations, depressions, or prolonged PR interval.  No narrowing or widening of the QRS complexes.  Reviewed EKG with Dr. Sheryle Hailiamond  ASSESSMENT & PLAN:  1. Chest wall pain     Meds ordered this encounter  Medications  . naproxen (NAPROSYN) 375 MG tablet    Sig: Take 1 tablet (375 mg total) by mouth 2 (two) times daily.    Dispense:  20 tablet    Refill:  0    Order Specific Question:   Supervising Provider    Answer:   Eustace MooreELSON, YVONNE SUE [[id number]]   EKG did not show concerning features Chest discomfort most likely musculoskeletal in nature Avoid strenuous upper body exercises until symptoms resolve In the meantime eat a well balanced diet of fruits, vegetables and lean meats.  Avoid foods high in fat and salt Drink water.  At least half your body weight in ounces Avoid excessive caffeine intake.   Naproxen prescribed.  Take as needed for pain.   Follow up with PCP for further evaluation and management Return or go to the ED if you have any new or worsening symptoms such as vision changes, fatigue, dizziness, chest pain, shortness of breath, nausea, swelling in your hands or feet, urinary symptoms, etc...  Chest pain precautions given. Reviewed expectations re: course of current medical issues. Questions answered. Outlined signs and symptoms indicating need for more acute intervention. Patient verbalized understanding. After Visit Summary given.   Rennis HardingWurst, Dajaun Goldring, PA-C 04/20/18 [id number]

## 2018-04-20 NOTE — ED Triage Notes (Signed)
Pt sts mid sternal CP with movement, inspitation and coug hx 3 days

## 2018-04-20 NOTE — Discharge Instructions (Signed)
EKG did not show concerning features Chest discomfort most likely musculoskeletal in nature Avoid strenuous upper body exercises until symptoms resolve In the meantime eat a well balanced diet of fruits, vegetables and lean meats.  Avoid foods high in fat and salt Drink water.  At least half your body weight in ounces Avoid excessive caffeine intake.   Naproxen prescribed.  Take as needed for pain.   Follow up with PCP for further evaluation and management Return or go to the ED if you have any new or worsening symptoms such as vision changes, fatigue, dizziness, chest pain, shortness of breath, nausea, swelling in your hands or feet, urinary symptoms, etc...Marland Kitchen

## 2019-03-20 ENCOUNTER — Telehealth: Payer: BC Managed Care – PPO | Admitting: Family Medicine

## 2019-03-20 ENCOUNTER — Telehealth: Payer: Self-pay | Admitting: *Deleted

## 2019-03-20 NOTE — Telephone Encounter (Signed)
Pt calling again to get OV scheduled for Monday.  Tried office, no answer.

## 2019-03-20 NOTE — Telephone Encounter (Signed)
I called patient because someone scheduled him for 10 am today (03/20/19) instead of Monday, 03/23/19 at 10am. I was able to change to 03/23/19 at this time. Patient verbalized an understanding.

## 2019-03-20 NOTE — Telephone Encounter (Signed)
Attempted to contact patient to schedule an appointment. No answer. LVM for patient to return call to schedule an appointment,

## 2019-03-20 NOTE — Telephone Encounter (Signed)
Copied from [place] [id number]. Topic: Appointment Scheduling - Scheduling Inquiry for Clinic >> Mar 20, 2019 12:49 PM Erick Blinks wrote: Reason for CRM: Pt called and is requesting to be scheduled with PCP Monday for problems focusing in school. ADD potentially. Please advise Best contact: [phone]

## 2019-03-23 ENCOUNTER — Telehealth (INDEPENDENT_AMBULATORY_CARE_PROVIDER_SITE_OTHER): Payer: BC Managed Care – PPO | Admitting: Family Medicine

## 2019-03-23 ENCOUNTER — Other Ambulatory Visit: Payer: Self-pay

## 2019-03-23 DIAGNOSIS — R41840 Attention and concentration deficit: Secondary | ICD-10-CM | POA: Diagnosis not present

## 2019-03-23 NOTE — Progress Notes (Signed)
This visit type was conducted due to national recommendations for restrictions regarding the COVID-19 pandemic in an effort to limit this patient's exposure and mitigate transmission in our community.   Virtual Visit via Video Note  I connected with Larry Carney on 03/23/19 at 10:00 AM EST by a video enabled telemedicine application and verified that I am speaking with the correct person using two identifiers.  Location patient: home Location provider:work or home office Persons participating in the virtual visit: patient, provider  I discussed the limitations of evaluation and management by telemedicine and the availability of in person appointments. The patient expressed understanding and agreed to proceed.   HPI: Larry Carney is seen with concern for possible attention deficit disorder.  He is currently very busy with 16 hours of [employer], working part-time, and active participation in his church.  He had difficulty focusing and easy distractibility and frequent daydreaming.  Poor performance this past year with school.  He is specifically interested in looking at ADD assessment.  He states that couple years ago he had a friend that was on Adderall and he took just 1 dose and did note significant improvement.  He is aware that has had difficulty with focus and easy distractibility throughout his school years.  He has noted easy distractibility in multiple environments   ROS: See pertinent positives and negatives per HPI.  Past Medical History:  Diagnosis Date  . Allergy     No past surgical history on file.  Family History  Problem Relation Age of Onset  . Stroke Maternal Grandmother   . Hypertension Maternal Grandmother   . Hypertension Maternal Grandfather   . Hypertension Paternal Grandmother   . Hypertension Paternal Grandfather     SOCIAL HX: Non-smoker   Current Outpatient Medications:  .  aluminum chloride (DRYSOL) 20 % external solution, Apply topically at  bedtime., Disp: 35 mL, Rfl: 1 .  naproxen (NAPROSYN) 375 MG tablet, Take 1 tablet (375 mg total) by mouth 2 (two) times daily., Disp: 20 tablet, Rfl: 0  EXAM:  VITALS per patient if applicable:  GENERAL: alert, oriented, appears well and in no acute distress  HEENT: atraumatic, conjunttiva clear, no obvious abnormalities on inspection of external nose and ears  NECK: normal movements of the head and neck  LUNGS: on inspection no signs of respiratory distress, breathing rate appears normal, no obvious gross SOB, gasping or wheezing  CV: no obvious cyanosis  MS: moves all visible extremities without noticeable abnormality  PSYCH/NEURO: pleasant and cooperative, no obvious depression or anxiety, speech and thought processing grossly intact  ASSESSMENT AND PLAN:  Discussed the following assessment and plan:  Inattention-possible attention deficit disorder -Recommend setting up more formal testing with one of our psychologist and he was given information regarding that to set up on his own.  He will be back in touch after that testing is performed     I discussed the assessment and treatment plan with the patient. The patient was provided an opportunity to ask questions and all were answered. The patient agreed with the plan and demonstrated an understanding of the instructions.   The patient was advised to call back or seek an in-person evaluation if the symptoms worsen or if the condition fails to improve as anticipated.     Carolann Littler, MD

## 2019-04-02 ENCOUNTER — Other Ambulatory Visit: Payer: Self-pay

## 2019-04-03 ENCOUNTER — Other Ambulatory Visit: Payer: Self-pay

## 2019-04-03 ENCOUNTER — Encounter: Payer: Self-pay | Admitting: Family Medicine

## 2019-04-03 ENCOUNTER — Ambulatory Visit (INDEPENDENT_AMBULATORY_CARE_PROVIDER_SITE_OTHER): Payer: BC Managed Care – PPO | Admitting: Family Medicine

## 2019-04-03 VITALS — BP 108/66 | HR 109 | Temp 98.0°F | Ht 70.0 in | Wt 210.1 lb

## 2019-04-03 DIAGNOSIS — Z0000 Encounter for general adult medical examination without abnormal findings: Secondary | ICD-10-CM

## 2019-04-03 NOTE — Patient Instructions (Signed)
Preventive Care 19-25 Years Old, Male Preventive care refers to lifestyle choices and visits with your health care provider that can promote health and wellness. This includes:  A yearly physical exam. This is also called an annual well check.  Regular dental and eye exams.  Immunizations.  Screening for certain conditions.  Healthy lifestyle choices, such as eating a healthy diet, getting regular exercise, not using drugs or products that contain nicotine and tobacco, and limiting alcohol use. What can I expect for my preventive care visit? Physical exam Your health care provider will check:  Height and weight. These may be used to calculate body mass index (BMI), which is a measurement that tells if you are at a healthy weight.  Heart rate and blood pressure.  Your skin for abnormal spots. Counseling Your health care provider may ask you questions about:  Alcohol, tobacco, and drug use.  Emotional well-being.  Home and relationship well-being.  Sexual activity.  Eating habits.  Work and work Statistician. What immunizations do I need?  Influenza (flu) vaccine  This is recommended every year. Tetanus, diphtheria, and pertussis (Tdap) vaccine  You may need a Td booster every 10 years. Varicella (chickenpox) vaccine  You may need this vaccine if you have not already been vaccinated. Human papillomavirus (HPV) vaccine  If recommended by your health care provider, you may need three doses over 6 months. Measles, mumps, and rubella (MMR) vaccine  You may need at least one dose of MMR. You may also need a second dose. Meningococcal conjugate (MenACWY) vaccine  One dose is recommended if you are 45-76 years old and a Market researcher living in a residence hall, or if you have one of several medical conditions. You may also need additional booster doses. Pneumococcal conjugate (PCV13) vaccine  You may need this if you have certain conditions and were not  previously vaccinated. Pneumococcal polysaccharide (PPSV23) vaccine  You may need one or two doses if you smoke cigarettes or if you have certain conditions. Hepatitis A vaccine  You may need this if you have certain conditions or if you travel or work in places where you may be exposed to hepatitis A. Hepatitis B vaccine  You may need this if you have certain conditions or if you travel or work in places where you may be exposed to hepatitis B. Haemophilus influenzae type b (Hib) vaccine  You may need this if you have certain risk factors. You may receive vaccines as individual doses or as more than one vaccine together in one shot (combination vaccines). Talk with your health care provider about the risks and benefits of combination vaccines. What tests do I need? Blood tests  Lipid and cholesterol levels. These may be checked every 5 years starting at age 17.  Hepatitis C test.  Hepatitis B test. Screening   Diabetes screening. This is done by checking your blood sugar (glucose) after you have not eaten for a while (fasting).  Sexually transmitted disease (STD) testing. Talk with your health care provider about your test results, treatment options, and if necessary, the need for more tests. Follow these instructions at home: Eating and drinking   Eat a diet that includes fresh fruits and vegetables, whole grains, lean protein, and low-fat dairy products.  Take vitamin and mineral supplements as recommended by your health care provider.  Do not drink alcohol if your health care provider tells you not to drink.  If you drink alcohol: ? Limit how much you have to 0-2  drinks a day. ? Be aware of how much alcohol is in your drink. In the [place] one drink equals one 12 oz bottle of beer (355 mL), one 5 oz glass of wine (148 mL), or one 1 oz glass of hard liquor (44 mL). Lifestyle  Take daily care of your teeth and gums.  Stay active. Exercise for at least 30 minutes on 5 or  more days each week.  Do not use any products that contain nicotine or tobacco, such as cigarettes, e-cigarettes, and chewing tobacco. If you need help quitting, ask your health care provider.  If you are sexually active, practice safe sex. Use a condom or other form of protection to prevent STIs (sexually transmitted infections). What's next?  Go to your health care provider once a year for a well check visit.  Ask your health care provider how often you should have your eyes and teeth checked.  Stay up to date on all vaccines. This information is not intended to replace advice given to you by your health care provider. Make sure you discuss any questions you have with your health care provider. Document Released: 06/12/2001 Document Revised: 04/10/2018 Document Reviewed: 04/10/2018 Elsevier Patient Education  2020 Elsevier Inc.  

## 2019-04-03 NOTE — Progress Notes (Signed)
Subjective:     Patient ID: Larry Carney, male   DOB: February 22, 1996, 23 y.o.   MRN: [id number]  HPI Patient here for physical exam.  He has 2 more semesters to complete his undergraduate studies.  He is also working part-time currently at [employer]  He had called recently with concerns for ADD.  Still in process of trying to get evaluation lined up.  There seems to be a waiting list everywhere he is called.  He is generally very healthy.  He takes no medications.  He exercises several times per week.  Last tetanus unknown.  He declines flu vaccine.  He states other immunizations are up-to-date.  Past Medical History:  Diagnosis Date  . Allergy    History reviewed. No pertinent surgical history.  reports that he has never smoked. He has never used smokeless tobacco. He reports that he does not drink alcohol or use drugs. family history includes Hypertension in his maternal grandfather, maternal grandmother, paternal grandfather, and paternal grandmother; Stroke in his maternal grandmother. No Known Allergies   Review of Systems  Constitutional: Negative for activity change, appetite change, fatigue and fever.  HENT: Negative for congestion, ear pain and trouble swallowing.   Eyes: Negative for pain and visual disturbance.  Respiratory: Negative for cough, shortness of breath and wheezing.   Cardiovascular: Negative for chest pain and palpitations.  Gastrointestinal: Negative for abdominal distention, abdominal pain, blood in stool, constipation, diarrhea, nausea, rectal pain and vomiting.  Genitourinary: Negative for dysuria, hematuria and testicular pain.  Musculoskeletal: Negative for arthralgias and joint swelling.  Skin: Negative for rash.  Neurological: Negative for dizziness, syncope and headaches.  Hematological: Negative for adenopathy.  Psychiatric/Behavioral: Negative for confusion and dysphoric mood.       Objective:   Physical Exam Constitutional:      General: He  is not in acute distress.    Appearance: He is well-developed.  HENT:     Head: Normocephalic and atraumatic.     Right Ear: External ear normal.     Left Ear: External ear normal.  Eyes:     Conjunctiva/sclera: Conjunctivae normal.     Pupils: Pupils are equal, round, and reactive to light.  Neck:     Musculoskeletal: Normal range of motion and neck supple.     Thyroid: No thyromegaly.  Cardiovascular:     Rate and Rhythm: Normal rate and regular rhythm.     Heart sounds: Normal heart sounds. No murmur.  Pulmonary:     Effort: No respiratory distress.     Breath sounds: No wheezing or rales.  Abdominal:     General: Bowel sounds are normal. There is no distension.     Palpations: Abdomen is soft. There is no mass.     Tenderness: There is no abdominal tenderness. There is no guarding or rebound.  Lymphadenopathy:     Cervical: No cervical adenopathy.  Skin:    Findings: No rash.  Neurological:     Mental Status: He is alert and oriented to person, place, and time.     Cranial Nerves: No cranial nerve deficit.     Deep Tendon Reflexes: Reflexes normal.        Assessment:     Physical exam.  Generally healthy 24 year old male with no significant chronic medical problems    Plan:     -Flu vaccine offered and patient declines -Confirm date of last tetanus -Obtain screening labs -Continue regular exercise habits  Eulas Post MD [facility]

## 2019-04-04 LAB — BASIC METABOLIC PANEL
BUN: 8 mg/dL (ref 7–25)
CO2: 25 mmol/L (ref 20–32)
Calcium: 9.6 mg/dL (ref 8.6–10.3)
Chloride: 102 mmol/L (ref 98–110)
Creat: 1.07 mg/dL (ref 0.60–1.35)
Glucose, Bld: 72 mg/dL (ref 65–99)
Potassium: 3.8 mmol/L (ref 3.5–5.3)
Sodium: 139 mmol/L (ref 135–146)

## 2019-04-04 LAB — CBC WITH DIFFERENTIAL/PLATELET
Absolute Monocytes: 389 cells/uL (ref 200–950)
Basophils Absolute: 38 cells/uL (ref 0–200)
Basophils Relative: 0.8 %
Eosinophils Absolute: 101 cells/uL (ref 15–500)
Eosinophils Relative: 2.1 %
HCT: 42.7 % (ref 38.5–50.0)
Hemoglobin: 14 g/dL (ref 13.2–17.1)
Lymphs Abs: 1181 cells/uL (ref 850–3900)
MCH: 28.7 pg (ref 27.0–33.0)
MCHC: 32.8 g/dL (ref 32.0–36.0)
MCV: 87.5 fL (ref 80.0–100.0)
MPV: 11.3 fL (ref 7.5–12.5)
Monocytes Relative: 8.1 %
Neutro Abs: 3091 cells/uL (ref 1500–7800)
Neutrophils Relative %: 64.4 %
Platelets: 159 10*3/uL (ref 140–400)
RBC: 4.88 10*6/uL (ref 4.20–5.80)
RDW: 12.2 % (ref 11.0–15.0)
Total Lymphocyte: 24.6 %
WBC: 4.8 10*3/uL (ref 3.8–10.8)

## 2019-04-04 LAB — HEPATIC FUNCTION PANEL
AG Ratio: 2.1 (calc) (ref 1.0–2.5)
ALT: 16 U/L (ref 9–46)
AST: 30 U/L (ref 10–40)
Albumin: 4.8 g/dL (ref 3.6–5.1)
Alkaline phosphatase (APISO): 66 U/L (ref 36–130)
Bilirubin, Direct: 0.2 mg/dL (ref 0.0–0.2)
Globulin: 2.3 g/dL (calc) (ref 1.9–3.7)
Indirect Bilirubin: 0.7 mg/dL (calc) (ref 0.2–1.2)
Total Bilirubin: 0.9 mg/dL (ref 0.2–1.2)
Total Protein: 7.1 g/dL (ref 6.1–8.1)

## 2019-04-04 LAB — TSH: TSH: 1.56 mIU/L (ref 0.40–4.50)

## 2019-04-04 LAB — LIPID PANEL
Cholesterol: 142 mg/dL (ref <200)
HDL: 47 mg/dL (ref >=40)
LDL Cholesterol (Calc): 84 mg/dL (calc)
Non-HDL Cholesterol (Calc): 95 mg/dL (calc) (ref <130)
Total CHOL/HDL Ratio: 3 (calc) (ref <5.0)
Triglycerides: 39 mg/dL (ref <150)

## 2019-05-08 ENCOUNTER — Ambulatory Visit: Payer: BC Managed Care – PPO | Attending: Internal Medicine

## 2019-05-08 DIAGNOSIS — Z20822 Contact with and (suspected) exposure to covid-19: Secondary | ICD-10-CM

## 2019-05-10 LAB — NOVEL CORONAVIRUS, NAA: SARS-CoV-2, NAA: DETECTED — AB

## 2020-04-13 ENCOUNTER — Encounter: Payer: Self-pay | Admitting: Family Medicine

## 2020-04-13 ENCOUNTER — Ambulatory Visit (INDEPENDENT_AMBULATORY_CARE_PROVIDER_SITE_OTHER): Payer: Self-pay | Admitting: Family Medicine

## 2020-04-13 ENCOUNTER — Other Ambulatory Visit: Payer: Self-pay

## 2020-04-13 VITALS — BP 90/56 | HR 56 | Temp 98.3°F | Ht 70.0 in | Wt 210.0 lb

## 2020-04-13 DIAGNOSIS — M436 Torticollis: Secondary | ICD-10-CM

## 2020-04-13 MED ORDER — METHOCARBAMOL 500 MG PO TABS: 500.0000 mg | ORAL_TABLET | Freq: Three times a day (TID) | ORAL | 0 refills | Status: AC | PRN

## 2020-04-13 NOTE — Progress Notes (Signed)
Established Patient Office Visit  Subjective:  Patient ID: Larry Carney, male    DOB: [birth year]  Age: 24 y.o. MRN: [id number]  CC:  Chief Complaint  Patient presents with  . Neck Pain    And stiffness x1 month, no known injury    HPI Larry Carney presents for increased "stiffness "neck mostly left side.  He does lift weights fairly regularly but denies any exercises specifically for the neck.  He has done some rowing in the past but not recently.  Denies any significant neck pain.  No radiculitis symptoms.  No upper extremity numbness or weakness.  Muscles feel stiff pretty much throughout the day.  He has tried some Tylenol which helps slightly.  He has not tried any topical heat or sports creams or any massage.  No recent injury.  Past Medical History:  Diagnosis Date  . Allergy     History reviewed. No pertinent surgical history.  Family History  Problem Relation Age of Onset  . Stroke Maternal Grandmother   . Hypertension Maternal Grandmother   . Hypertension Maternal Grandfather   . Hypertension Paternal Grandmother   . Hypertension Paternal Grandfather     Social History   Socioeconomic History  . Marital status: Single    Spouse name: Not on file  . Number of children: Not on file  . Years of education: Not on file  . Highest education level: Not on file  Occupational History  . Not on file  Tobacco Use  . Smoking status: Never Smoker  . Smokeless tobacco: Never Used  Vaping Use  . Vaping Use: Former  Substance and Sexual Activity  . Alcohol use: Never  . Drug use: Never  . Sexual activity: Not on file  Other Topics Concern  . Not on file  Social History Narrative  . Not on file   Social Determinants of Health   Financial Resource Strain: Not on file  Food Insecurity: Not on file  Transportation Needs: Not on file  Physical Activity: Not on file  Stress: Not on file  Social Connections: Not on file  Intimate Partner Violence: Not on  file    Outpatient Medications Prior to Visit  Medication Sig Dispense Refill  . aluminum chloride (DRYSOL) 20 % external solution Apply topically at bedtime. (Patient not taking: Reported on 04/03/2019) 35 mL 1  . amoxicillin (AMOXIL) 500 MG capsule Take 500 mg by mouth 3 (three) times daily.    . naproxen (NAPROSYN) 375 MG tablet Take 1 tablet (375 mg total) by mouth 2 (two) times daily. (Patient not taking: Reported on 04/03/2019) 20 tablet 0   No facility-administered medications prior to visit.    No Known Allergies  ROS Review of Systems  Neurological: Negative for weakness and numbness.      Objective:    Physical Exam Vitals reviewed.  Constitutional:      Appearance: Normal appearance.  Cardiovascular:     Rate and Rhythm: Normal rate and regular rhythm.  Pulmonary:     Effort: Pulmonary effort is normal.     Breath sounds: Normal breath sounds.  Musculoskeletal:     Comments: He has fairly good range of motion with flexion, extension, lateral bending, and rotation to the right and left.  He does have some mild muscular tenderness left side of neck but no spinal tenderness.  Full strength upper extremities  Neurological:     Mental Status: He is alert.     BP (!) 90/56 (BP  Location: Left Arm, Patient Position: Sitting, Cuff Size: Large)   Pulse (!) 56   Temp 98.3 F (36.8 C) (Oral)   Ht 5\' 10"  (1.778 m)   Wt 210 lb (95.3 kg)   BMI 30.13 kg/m  Wt Readings from Last 3 Encounters:  04/13/20 210 lb (95.3 kg)  04/03/19 210 lb 1.6 oz (95.3 kg)  03/01/18 207 lb (93.9 kg)     Health Maintenance Due  Topic Date Due  . Hepatitis C Screening  Never done  . HIV Screening  Never done  . TETANUS/TDAP  Never done    There are no preventive care reminders to display for this patient.  Lab Results  Component Value Date   TSH 1.56 04/03/2019   Lab Results  Component Value Date   WBC 4.8 04/03/2019   HGB 14.0 04/03/2019   HCT 42.7 04/03/2019   MCV 87.5  04/03/2019   PLT 159 04/03/2019   Lab Results  Component Value Date   NA 139 04/03/2019   K 3.8 04/03/2019   CO2 25 04/03/2019   GLUCOSE 72 04/03/2019   BUN 8 04/03/2019   CREATININE 1.07 04/03/2019   BILITOT 0.9 04/03/2019   ALKPHOS 58 12/06/2015   AST 30 04/03/2019   ALT 16 04/03/2019   PROT 7.1 04/03/2019   ALBUMIN 4.6 12/06/2015   CALCIUM 9.6 04/03/2019   GFR 112.63 09/30/2015   Lab Results  Component Value Date   CHOL 142 04/03/2019   Lab Results  Component Value Date   HDL 47 04/03/2019   Lab Results  Component Value Date   LDLCALC 84 04/03/2019   Lab Results  Component Value Date   TRIG 39 04/03/2019   Lab Results  Component Value Date   CHOLHDL 3.0 04/03/2019   No results found for: HGBA1C    Assessment & Plan:   Increased muscle tension involving the neck. -We recommend he avoid lifting exercises that create increased tension on the neck -Try some topical heat and consider topical sports creams with massage -Short-term trial of Robaxin 500 mg nightly -Supplement as needed with Aleve or ibuprofen -Consider trial of physical therapy if not improving in the next couple weeks  Meds ordered this encounter  Medications  . methocarbamol (ROBAXIN) 500 MG tablet    Sig: Take 1 tablet (500 mg total) by mouth every 8 (eight) hours as needed for muscle spasms.    Dispense:  30 tablet    Refill:  0    Follow-up: No follow-ups on file.    14/07/2018, MD

## 2020-04-13 NOTE — Patient Instructions (Signed)

## 2021-05-09 ENCOUNTER — Other Ambulatory Visit: Payer: Self-pay

## 2021-05-09 ENCOUNTER — Telehealth (INDEPENDENT_AMBULATORY_CARE_PROVIDER_SITE_OTHER): Payer: Self-pay | Admitting: Family Medicine

## 2021-05-09 DIAGNOSIS — J209 Acute bronchitis, unspecified: Secondary | ICD-10-CM

## 2021-05-09 MED ORDER — AZITHROMYCIN 250 MG PO TABS: ORAL_TABLET | ORAL | 0 refills | Status: AC

## 2021-05-09 NOTE — Progress Notes (Signed)
Patient ID: Fredrico Beedle Som-Pimpong, male   DOB: [birth year], 26 y.o.   MRN: [id number]  This visit type was conducted due to national recommendations for restrictions regarding the COVID-19 pandemic in an effort to limit this patient's exposure and mitigate transmission in our community.   Virtual Visit via Telephone Note  I connected with Ayson Som-Pimpong on 05/09/21 at 10:00 AM EST by telephone and verified that I am speaking with the correct person using two identifiers.   I discussed the limitations, risks, security and privacy concerns of performing an evaluation and management service by telephone and the availability of in person appointments. I also discussed with the patient that there may be a patient responsible charge related to this service. The patient expressed understanding and agreed to proceed.  Location patient: home Location provider: work or home office Participants present for the call: patient, provider Patient did not have a visit in the prior 7 days to address this/these issue(s).   History of Present Illness:  Patient seen with cough which started over a week ago.  Cough seems to be worsening.  Productive of thick colored sputum.  No fever.  No dyspnea.  Some increased malaise.  Non-smoker.  Denies any facial pain.  No sore throat symptoms.  He has home COVID test but has not done this yet.  Past Medical History:  Diagnosis Date   Allergy    No past surgical history on file.  reports that he has never smoked. He has never used smokeless tobacco. He reports that he does not drink alcohol and does not use drugs. family history includes Hypertension in his maternal grandfather, maternal grandmother, paternal grandfather, and paternal grandmother; Stroke in his maternal grandmother. No Known Allergies  Observations/Objective: Patient sounds cheerful and well on the phone. I do not appreciate any SOB. Speech and thought processing are grossly intact. Patient reported  vitals:  Assessment and Plan:  Acute bronchial infection.  We explained that bronchitis is frequently viral.  However, he has had this over a week and seems to be worsening.  We agreed to cover with Zithromax for 5 days.  Recommend over-the-counter Mucinex.  Needs an office follow-up for further assessment if not improving over the next several days  Follow Up Instructions:   [id number] 5-10 [id number] 11-20 [id number] 21-30 I did not refer this patient for an OV in the next 24 hours for this/these issue(s).  I discussed the assessment and treatment plan with the patient. The patient was provided an opportunity to ask questions and all were answered. The patient agreed with the plan and demonstrated an understanding of the instructions.   The patient was advised to call back or seek an in-person evaluation if the symptoms worsen or if the condition fails to improve as anticipated.  I provided 12 minutes of non-face-to-face time during this encounter.   Evelena Peat, MD

## 2021-08-18 ENCOUNTER — Ambulatory Visit (INDEPENDENT_AMBULATORY_CARE_PROVIDER_SITE_OTHER): Payer: BC Managed Care – PPO | Admitting: Family Medicine

## 2021-08-18 ENCOUNTER — Encounter: Payer: Self-pay | Admitting: Family Medicine

## 2021-08-18 VITALS — BP 114/70 | HR 74 | Temp 97.9°F | Ht 70.08 in | Wt 200.7 lb

## 2021-08-18 DIAGNOSIS — Z0000 Encounter for general adult medical examination without abnormal findings: Secondary | ICD-10-CM | POA: Diagnosis not present

## 2021-08-18 DIAGNOSIS — Z1159 Encounter for screening for other viral diseases: Secondary | ICD-10-CM | POA: Diagnosis not present

## 2021-08-18 LAB — LIPID PANEL
Cholesterol: 142 mg/dL (ref 0–200)
HDL: 45.1 mg/dL (ref >39.00)
LDL Cholesterol: 89 mg/dL (ref 0–99)
NonHDL: 96.5
Total CHOL/HDL Ratio: 3
Triglycerides: 37 mg/dL (ref 0.0–149.0)
VLDL: 7.4 mg/dL (ref 0.0–40.0)

## 2021-08-18 LAB — TSH: TSH: 3.07 u[IU]/mL (ref 0.35–5.50)

## 2021-08-18 LAB — BASIC METABOLIC PANEL
BUN: 13 mg/dL (ref 6–23)
CO2: 29 mEq/L (ref 19–32)
Calcium: 9.4 mg/dL (ref 8.4–10.5)
Chloride: 104 mEq/L (ref 96–112)
Creatinine, Ser: 1.09 mg/dL (ref 0.40–1.50)
GFR: 92.94 mL/min (ref >60.00)
Glucose, Bld: 82 mg/dL (ref 70–99)
Potassium: 3.9 mEq/L (ref 3.5–5.1)
Sodium: 141 mEq/L (ref 135–145)

## 2021-08-18 LAB — CBC WITH DIFFERENTIAL/PLATELET
Basophils Absolute: 0 10*3/uL (ref 0.0–0.1)
Basophils Relative: 1.1 % (ref 0.0–3.0)
Eosinophils Absolute: 0.1 10*3/uL (ref 0.0–0.7)
Eosinophils Relative: 4.1 % (ref 0.0–5.0)
HCT: 41.6 % (ref 39.0–52.0)
Hemoglobin: 14 g/dL (ref 13.0–17.0)
Lymphocytes Relative: 41 % (ref 12.0–46.0)
Lymphs Abs: 1.4 10*3/uL (ref 0.7–4.0)
MCHC: 33.6 g/dL (ref 30.0–36.0)
MCV: 85.9 fl (ref 78.0–100.0)
Monocytes Absolute: 0.3 10*3/uL (ref 0.1–1.0)
Monocytes Relative: 7.7 % (ref 3.0–12.0)
Neutro Abs: 1.6 10*3/uL (ref 1.4–7.7)
Neutrophils Relative %: 46.1 % (ref 43.0–77.0)
Platelets: 154 10*3/uL (ref 150.0–400.0)
RBC: 4.84 Mil/uL (ref 4.22–5.81)
RDW: 13 % (ref 11.5–15.5)
WBC: 3.4 10*3/uL — ABNORMAL LOW (ref 4.0–10.5)

## 2021-08-18 LAB — HEPATIC FUNCTION PANEL
ALT: 16 U/L (ref 0–53)
AST: 24 U/L (ref 0–37)
Albumin: 4.5 g/dL (ref 3.5–5.2)
Alkaline Phosphatase: 59 U/L (ref 39–117)
Bilirubin, Direct: 0.1 mg/dL (ref 0.0–0.3)
Total Bilirubin: 0.5 mg/dL (ref 0.2–1.2)
Total Protein: 7.4 g/dL (ref 6.0–8.3)

## 2021-08-18 NOTE — Patient Instructions (Signed)
Confirm date of last tetanus and need booster if > 10 years.   ?

## 2021-08-18 NOTE — Progress Notes (Signed)
?Subjective:  ?  ? Patient ID: Larry Carney, male   DOB: 09-07-1995, 26 y.o.   MRN: [id number] ? ?HPI ? ?Seen for physical exam.  Generally doing well.  He got married in September.  No children.  Graduated over a year ago.  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  Also works at [employer].  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 ? ?Health maintenance reviewed.  Last tetanus unknown.  No history of hepatitis C screening but low risk. ? ?Family history reviewed-parents are generally good health.  Denies any family history of hypertension, diabetes heart disease, or cancer.  He has 1 sister and 2 brothers who are healthy ? ?Social history-married.  No children.  Works as above for [employer].  Non-smoker.  No regular alcohol. ? ?Past Medical History:  ?Diagnosis Date  ? Allergy   ? ?No past surgical history on file. ? reports that he has never smoked. He has never used smokeless tobacco. He reports that he does not drink alcohol and does not use drugs. ?family history includes Hypertension in his maternal grandfather, maternal grandmother, paternal grandfather, and paternal grandmother; Stroke in his maternal grandmother. ?No Known Allergies ? ? ?Review of Systems  ?Constitutional:  Negative for activity change, appetite change, fatigue and fever.  ?HENT:  Negative for congestion, ear pain and trouble swallowing.   ?Eyes:  Negative for pain and visual disturbance.  ?Respiratory:  Negative for cough, shortness of breath and wheezing.   ?Cardiovascular:  Negative for chest pain and palpitations.  ?Gastrointestinal:  Negative for abdominal distention, abdominal pain, blood in stool, constipation, diarrhea, nausea, rectal pain and vomiting.  ?Genitourinary:  Negative for dysuria, hematuria and testicular pain.  ?Musculoskeletal:  Negative for arthralgias and joint swelling.  ?Skin:  Negative for rash.  ?Neurological:  Negative for  dizziness, syncope and headaches.  ?Hematological:  Negative for adenopathy.  ?Psychiatric/Behavioral:  Negative for confusion and dysphoric mood.   ? ?   ?Objective:  ? Physical Exam ?Constitutional:   ?   General: He is not in acute distress. ?   Appearance: He is well-developed.  ?HENT:  ?   Head: Normocephalic and atraumatic.  ?   Right Ear: External ear normal.  ?   Left Ear: External ear normal.  ?Eyes:  ?   Conjunctiva/sclera: Conjunctivae normal.  ?   Pupils: Pupils are equal, round, and reactive to light.  ?Neck:  ?   Thyroid: No thyromegaly.  ?Cardiovascular:  ?   Rate and Rhythm: Normal rate and regular rhythm.  ?   Heart sounds: Normal heart sounds. No murmur heard. ?Pulmonary:  ?   Effort: No respiratory distress.  ?   Breath sounds: No wheezing or rales.  ?Abdominal:  ?   General: Bowel sounds are normal. There is no distension.  ?   Palpations: Abdomen is soft. There is no mass.  ?   Tenderness: There is no abdominal tenderness. There is no guarding or rebound.  ?Musculoskeletal:  ?   Cervical back: Normal range of motion and neck supple.  ?Lymphadenopathy:  ?   Cervical: No cervical adenopathy.  ?Skin: ?   Findings: No rash.  ?Neurological:  ?   Mental Status: He is alert and oriented to person, place, and time.  ?   Cranial Nerves: No cranial nerve deficit.  ? ? ?   ?Assessment:  ?   ?Physical exam.  Generally healthy 26 year old male.  No chronic  medical problems.  We discussed the following health maintenance issues as below ?   ?Plan:  ?   ?-Confirm date of last tetanus and if greater than 10 years needs booster ?-Screening labs including hepatitis C antibody.  He is low risk. ?- discussed establishing more consistent exercise habits ? ?Kristian Covey MD ?[facility] at [facility] ? ?   ?

## 2021-08-21 LAB — HEPATITIS C ANTIBODY
Hepatitis C Ab: NONREACTIVE
SIGNAL TO CUT-OFF: 0.11 (ref <1.00)

## 2023-05-30 ENCOUNTER — Telehealth: Payer: Self-pay | Admitting: Family Medicine
